# Patient Record
Sex: Male | Born: 1950 | Race: White | Hispanic: No | Marital: Married | Smoking: Former smoker
Health system: Southern US, Community
[De-identification: ages and names within clinical notes are randomized; demographics above are authoritative.]

## PROBLEM LIST (undated history)

## (undated) DIAGNOSIS — E785 Hyperlipidemia, unspecified: Secondary | ICD-10-CM

## (undated) DIAGNOSIS — R011 Cardiac murmur, unspecified: Secondary | ICD-10-CM

## (undated) DIAGNOSIS — R001 Bradycardia, unspecified: Secondary | ICD-10-CM

---

## 2001-11-01 ENCOUNTER — Emergency Department (HOSPITAL_COMMUNITY): Admission: AC | Admit: 2001-11-01 | Discharge: 2001-11-01 | Payer: Self-pay

## 2001-11-01 ENCOUNTER — Encounter: Payer: Self-pay | Admitting: Emergency Medicine

## 2005-10-21 ENCOUNTER — Emergency Department: Payer: Self-pay | Admitting: Emergency Medicine

## 2010-02-15 ENCOUNTER — Ambulatory Visit: Payer: Self-pay | Admitting: Emergency Medicine

## 2010-09-23 ENCOUNTER — Ambulatory Visit: Payer: Self-pay | Admitting: Cardiovascular Disease

## 2012-10-18 ENCOUNTER — Inpatient Hospital Stay: Payer: Self-pay | Admitting: Surgery

## 2012-10-18 LAB — CBC
HCT: 40.2 % (ref 40.0–52.0)
HCT: 28.1 % — ABNORMAL LOW (ref 40.0–52.0)
HGB: 9.3 g/dL — ABNORMAL LOW (ref 13.0–18.0)
MCH: 29 pg (ref 26.0–34.0)
MCH: 28.7 pg (ref 26.0–34.0)
MCHC: 33.7 g/dL (ref 32.0–36.0)
MCHC: 33 g/dL (ref 32.0–36.0)
MCV: 86 fL (ref 80–100)
MCV: 87 fL (ref 80–100)
Platelet: 293 10*3/uL (ref 150–440)
Platelet: 228 10*3/uL (ref 150–440)
RBC: 4.66 10*6/uL (ref 4.40–5.90)
RDW: 14.2 % (ref 11.5–14.5)
RDW: 13.9 % (ref 11.5–14.5)
WBC: 4.3 10*3/uL (ref 3.8–10.6)
WBC: 16.4 10*3/uL — ABNORMAL HIGH (ref 3.8–10.6)

## 2012-10-18 LAB — BASIC METABOLIC PANEL
Anion Gap: 5 — ABNORMAL LOW (ref 7–16)
BUN: 11 mg/dL (ref 7–18)
Calcium, Total: 8.8 mg/dL (ref 8.5–10.1)
Chloride: 108 mmol/L — ABNORMAL HIGH (ref 98–107)
Co2: 26 mmol/L (ref 21–32)
Creatinine: 0.83 mg/dL (ref 0.60–1.30)
EGFR (African American): >60
Glucose: 148 mg/dL — ABNORMAL HIGH (ref 65–99)
Potassium: 4.2 mmol/L (ref 3.5–5.1)

## 2012-10-19 LAB — CBC WITH DIFFERENTIAL/PLATELET
Basophil #: 0.1 10*3/uL (ref 0.0–0.1)
Basophil %: 0.9 %
Eosinophil #: 0 10*3/uL (ref 0.0–0.7)
Eosinophil %: 0.3 %
HGB: 11 g/dL — ABNORMAL LOW (ref 13.0–18.0)
Lymphocyte %: 15.8 %
MCH: 29.4 pg (ref 26.0–34.0)
MCHC: 34.6 g/dL (ref 32.0–36.0)
MCV: 85 fL (ref 80–100)
Monocyte %: 7.8 %
Neutrophil #: 11.1 10*3/uL — ABNORMAL HIGH (ref 1.4–6.5)
Platelet: 162 10*3/uL (ref 150–440)
RDW: 14.1 % (ref 11.5–14.5)

## 2012-10-19 LAB — COMPREHENSIVE METABOLIC PANEL
Albumin: 2.6 g/dL — ABNORMAL LOW (ref 3.4–5.0)
Alkaline Phosphatase: 59 U/L (ref 50–136)
Anion Gap: 5 — ABNORMAL LOW (ref 7–16)
Bilirubin,Total: 1 mg/dL (ref 0.2–1.0)
Calcium, Total: 7.5 mg/dL — ABNORMAL LOW (ref 8.5–10.1)
Chloride: 108 mmol/L — ABNORMAL HIGH (ref 98–107)
Co2: 25 mmol/L (ref 21–32)
Creatinine: 0.79 mg/dL (ref 0.60–1.30)
EGFR (African American): >60
Osmolality: 276 (ref 275–301)
Potassium: 4.1 mmol/L (ref 3.5–5.1)
Total Protein: 5.1 g/dL — ABNORMAL LOW (ref 6.4–8.2)

## 2012-10-19 LAB — URINALYSIS, COMPLETE
Bilirubin,UR: NEGATIVE
Glucose,UR: NEGATIVE mg/dL (ref 0–75)
Leukocyte Esterase: NEGATIVE
Protein: NEGATIVE
RBC,UR: <1 /HPF (ref 0–5)
Squamous Epithelial: NONE SEEN

## 2012-10-19 LAB — APTT: Activated PTT: 25.2 secs (ref 23.6–35.9)

## 2012-10-19 LAB — LIPASE, BLOOD: Lipase: 73 U/L (ref 73–393)

## 2012-10-19 LAB — PROTIME-INR: Prothrombin Time: 14.3 secs (ref 11.5–14.7)

## 2012-11-27 ENCOUNTER — Ambulatory Visit: Payer: Self-pay | Admitting: Neurology

## 2013-07-30 ENCOUNTER — Emergency Department: Payer: Self-pay | Admitting: Internal Medicine

## 2013-09-06 ENCOUNTER — Emergency Department: Payer: Self-pay | Admitting: Emergency Medicine

## 2014-01-01 ENCOUNTER — Ambulatory Visit: Payer: Self-pay | Admitting: Orthopedic Surgery
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
## 2015-04-20 ENCOUNTER — Other Ambulatory Visit: Payer: Self-pay | Admitting: Nurse Practitioner

## 2015-04-20 DIAGNOSIS — R519 Headache, unspecified: Secondary | ICD-10-CM

## 2015-04-20 DIAGNOSIS — R51 Headache: Principal | ICD-10-CM

## 2015-04-23 ENCOUNTER — Ambulatory Visit
Admission: RE | Admit: 2015-04-23 | Discharge: 2015-04-23 | Disposition: A | Discharge status: Home / Self Care | Payer: Medicare HMO | Source: Ambulatory Visit | Attending: Nurse Practitioner | Admitting: Nurse Practitioner

## 2015-04-23 DIAGNOSIS — R51 Headache: Secondary | ICD-10-CM | POA: Insufficient documentation

## 2015-04-23 DIAGNOSIS — R519 Headache, unspecified: Secondary | ICD-10-CM

## 2017-02-21 IMAGING — CT CT HEAD W/O CM
1 series · 16 of 28 positions shown, 20 images · non-contrast
Comparison: MR brain 11/27/2012.

CLINICAL DATA: Severe posterior headache for several months getting
worse. History of remote head trauma.

EXAM:
CT HEAD WITHOUT CONTRAST
TECHNIQUE: Contiguous axial images were obtained from the base of the skull
through the vertex without intravenous contrast.

[Series 2: head wo · axial · 0.39mm/px · z∈[-73,+52]mm · 16 of 28 slices shown, 20 images]
[im 2/28  brain]
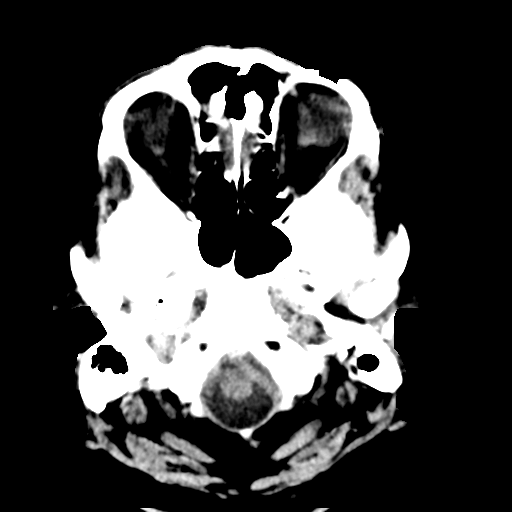
[im 2/28  bone]
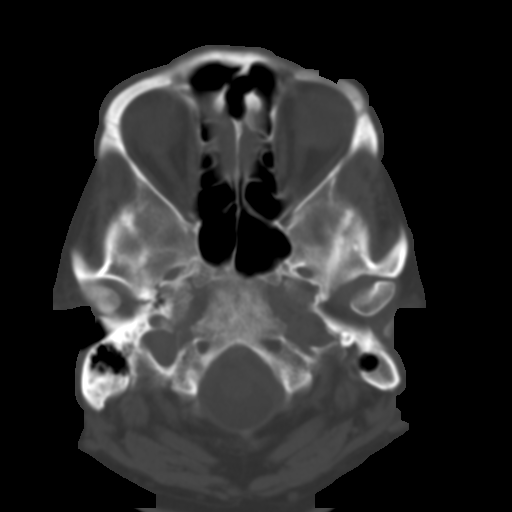
[im 4/28  brain]
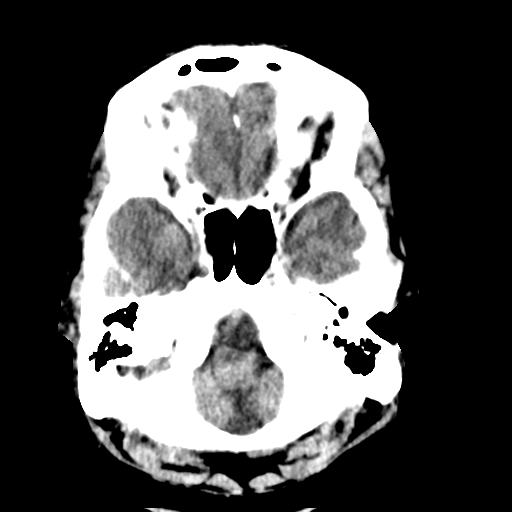
[im 6/28  brain]
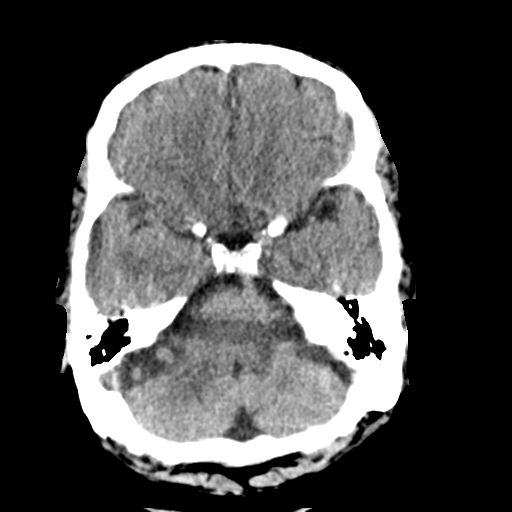
[im 7/28  brain]
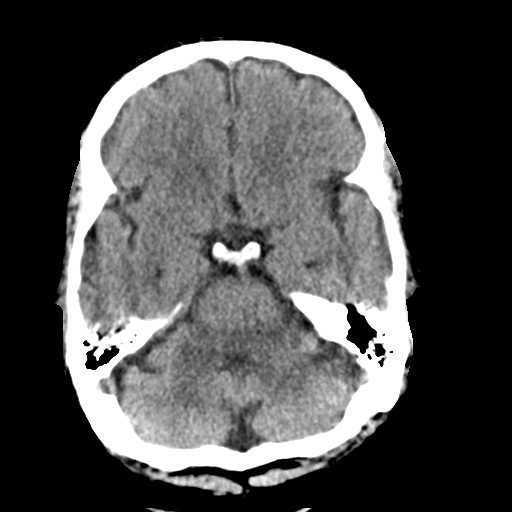
[im 9/28  brain]
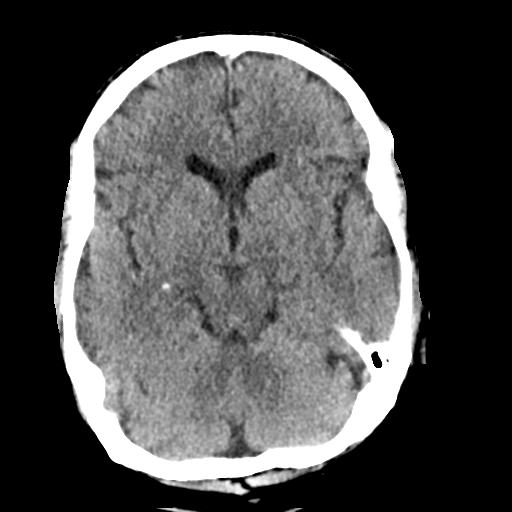
[im 9/28  bone]
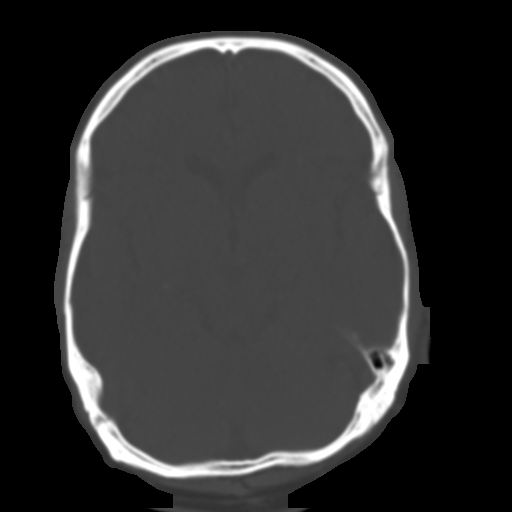
[im 10/28  brain]
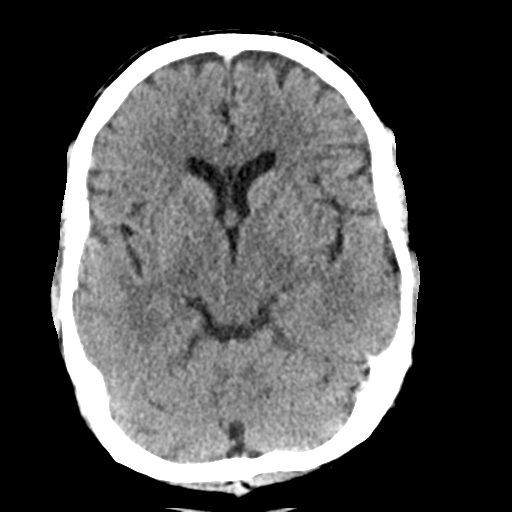
[im 12/28  brain]
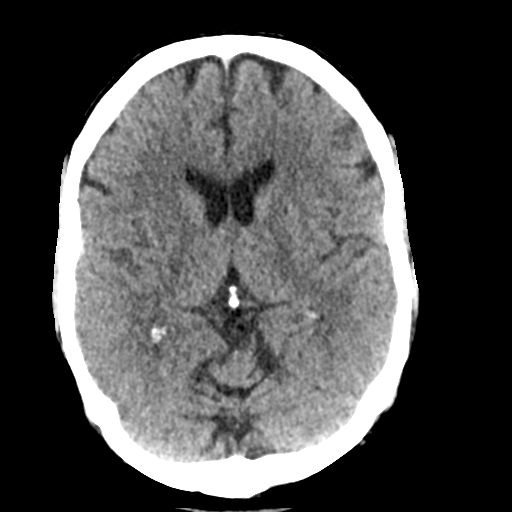
[im 14/28  brain]
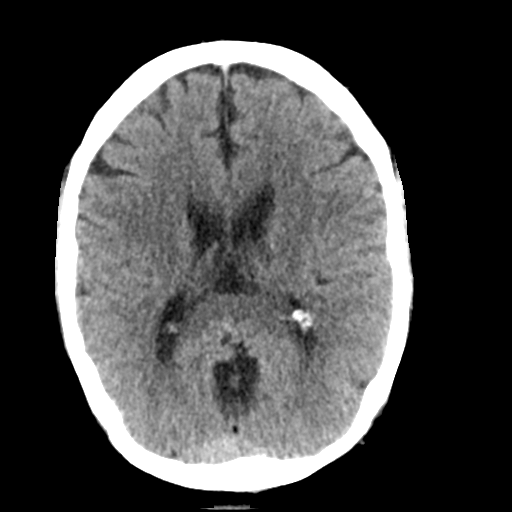
[im 15/28  brain]
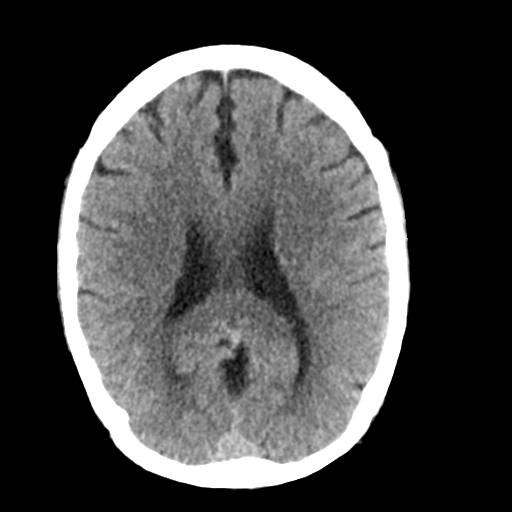
[im 15/28  bone]
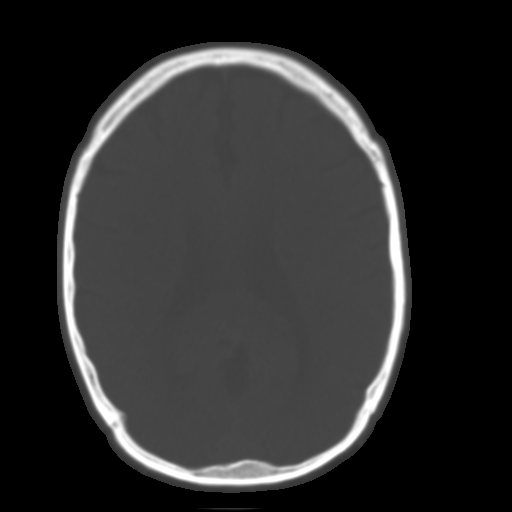
[im 17/28  brain]
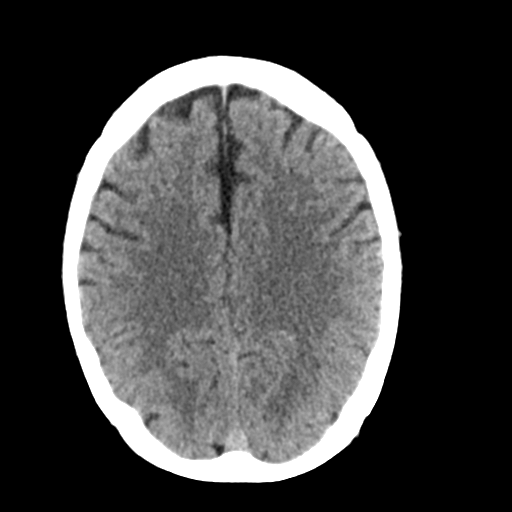
[im 19/28  brain]
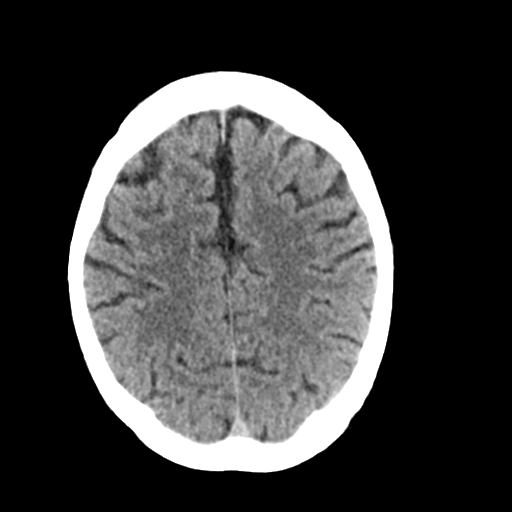
[im 20/28  brain]
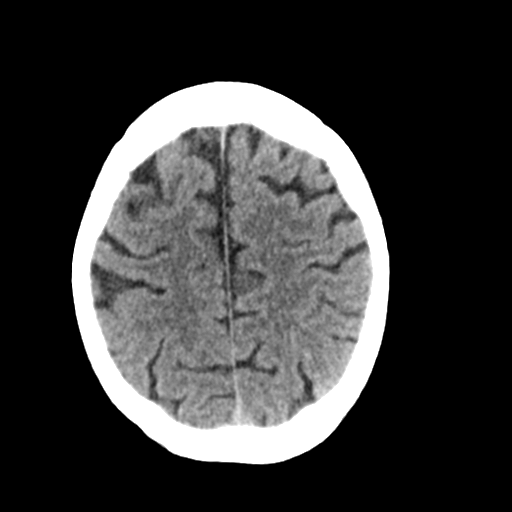
[im 22/28  brain]
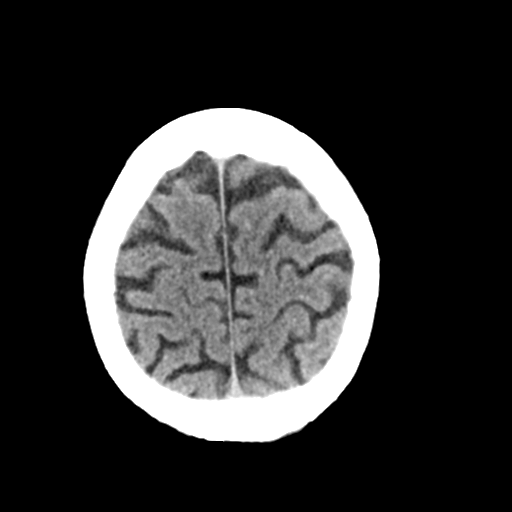
[im 22/28  bone]
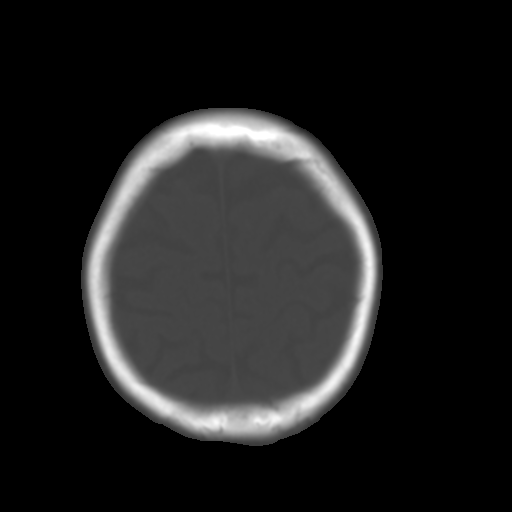
[im 23/28  brain]
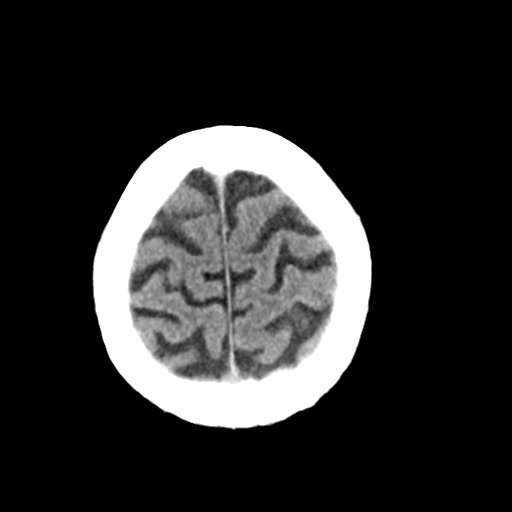
[im 25/28  brain]
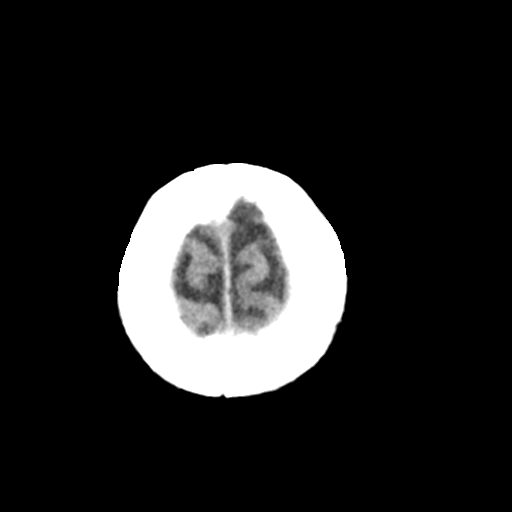
[im 27/28  brain]
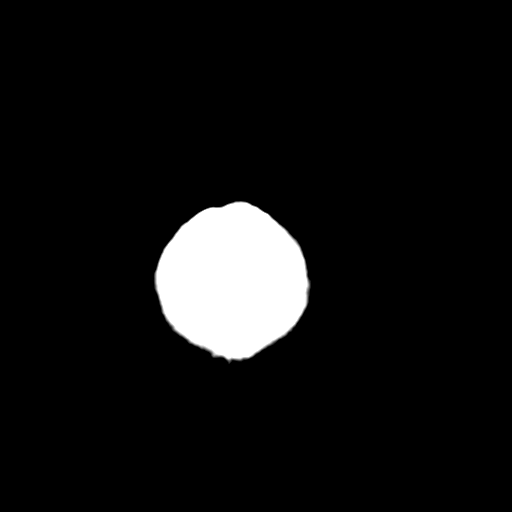

[16 of 28 positions shown; findings below may reference images not displayed]

FINDINGS: No evidence for acute infarction, hemorrhage, mass lesion,
hydrocephalus, or extra-axial fluid. Mild cerebral and cerebellar
atrophy. No significant white matter disease. No signs of cortical
or lacunar infarction. No features suggestive of large vessel
occlusion. Moderate vascular calcification. Calvarium intact. No
sinus or mastoid disease. Similar appearance to priors.
IMPRESSION: Mild chronic changes as described.  No acute intracranial findings.

## 2022-07-26 ENCOUNTER — Encounter: Payer: Self-pay | Admitting: Ophthalmology

## 2022-07-31 NOTE — Discharge Instructions (Signed)

## 2022-08-02 ENCOUNTER — Ambulatory Visit: Payer: Medicare HMO | Admitting: Anesthesiology

## 2022-08-02 ENCOUNTER — Other Ambulatory Visit: Payer: Self-pay

## 2022-08-02 ENCOUNTER — Ambulatory Visit
Admission: RE | Admit: 2022-08-02 | Discharge: 2022-08-02 | Disposition: A | Discharge status: Home / Self Care | Payer: Medicare HMO | Attending: Ophthalmology | Admitting: Ophthalmology

## 2022-08-02 ENCOUNTER — Encounter
Admission: RE | Disposition: A | Discharge status: Home / Self Care | Payer: Self-pay | Source: Home / Self Care | Attending: Ophthalmology

## 2022-08-02 ENCOUNTER — Encounter: Payer: Self-pay | Admitting: Ophthalmology

## 2022-08-02 DIAGNOSIS — E785 Hyperlipidemia, unspecified: Secondary | ICD-10-CM | POA: Insufficient documentation

## 2022-08-02 DIAGNOSIS — H2512 Age-related nuclear cataract, left eye: Secondary | ICD-10-CM | POA: Diagnosis present

## 2022-08-02 DIAGNOSIS — Z87891 Personal history of nicotine dependence: Secondary | ICD-10-CM | POA: Insufficient documentation

## 2022-08-02 HISTORY — DX: Cardiac murmur, unspecified: R01.1

## 2022-08-02 HISTORY — DX: Hyperlipidemia, unspecified: E78.5

## 2022-08-02 HISTORY — DX: Bradycardia, unspecified: R00.1

## 2022-08-02 HISTORY — PX: CATARACT EXTRACTION W/PHACO: SHX586

## 2022-08-02 SURGERY — PHACOEMULSIFICATION, CATARACT, WITH IOL INSERTION
Anesthesia: Monitor Anesthesia Care | Site: Eye | Laterality: Left

## 2022-08-02 MED ORDER — TETRACAINE HCL 0.5 % OP SOLN
1.0000 [drp] | OPHTHALMIC | Status: DC | PRN
  Administered 2022-08-02 (×3): 1 [drp] via OPHTHALMIC

## 2022-08-02 MED ORDER — SIGHTPATH DOSE#1 BSS IO SOLN
INTRAOCULAR | Status: DC | PRN
  Administered 2022-08-02: 1 mL

## 2022-08-02 MED ORDER — CEFUROXIME OPHTHALMIC INJECTION 1 MG/0.1 ML
INJECTION | OPHTHALMIC | Status: DC | PRN
  Administered 2022-08-02: .1 mL via INTRACAMERAL

## 2022-08-02 MED ORDER — FENTANYL CITRATE (PF) 100 MCG/2ML IJ SOLN
INTRAMUSCULAR | Status: DC | PRN
  Administered 2022-08-02 (×2): 50 ug via INTRAVENOUS

## 2022-08-02 MED ORDER — ARMC OPHTHALMIC DILATING DROPS
1.0000 | OPHTHALMIC | Status: DC | PRN
  Administered 2022-08-02 (×3): 1 via OPHTHALMIC

## 2022-08-02 MED ORDER — LACTATED RINGERS IV SOLN: INTRAVENOUS | Status: DC

## 2022-08-02 MED ORDER — BRIMONIDINE TARTRATE-TIMOLOL 0.2-0.5 % OP SOLN
OPHTHALMIC | Status: DC | PRN
  Administered 2022-08-02: 1 [drp] via OPHTHALMIC

## 2022-08-02 MED ORDER — SIGHTPATH DOSE#1 NA HYALUR & NA CHOND-NA HYALUR IO KIT
PACK | INTRAOCULAR | Status: DC | PRN
  Administered 2022-08-02: 1 via OPHTHALMIC

## 2022-08-02 MED ORDER — MIDAZOLAM HCL 2 MG/2ML IJ SOLN
INTRAMUSCULAR | Status: DC | PRN
  Administered 2022-08-02 (×2): 1 mg via INTRAVENOUS

## 2022-08-02 MED ORDER — SIGHTPATH DOSE#1 BSS IO SOLN
INTRAOCULAR | Status: DC | PRN
  Administered 2022-08-02: 73 mL via OPHTHALMIC

## 2022-08-02 MED ORDER — SIGHTPATH DOSE#1 BSS IO SOLN
INTRAOCULAR | Status: DC | PRN
  Administered 2022-08-02: 15 mL

## 2022-08-02 SURGICAL SUPPLY — 10 items
CATARACT SUITE SIGHTPATH (MISCELLANEOUS) ×1 IMPLANT
FEE CATARACT SUITE SIGHTPATH (MISCELLANEOUS) ×1 IMPLANT
GLOVE SRG 8 PF TXTR STRL LF DI (GLOVE) ×1 IMPLANT
GLOVE SURG ENC TEXT LTX SZ7.5 (GLOVE) ×1 IMPLANT
GLOVE SURG UNDER POLY LF SZ8 (GLOVE) ×1
LENS IOL TECNIS EYHANCE 20.0 (Intraocular Lens) IMPLANT
NDL FILTER BLUNT 18X1 1/2 (NEEDLE) ×1 IMPLANT
NEEDLE FILTER BLUNT 18X1 1/2 (NEEDLE) ×1 IMPLANT
SYR 3ML LL SCALE MARK (SYRINGE) ×1 IMPLANT
WATER STERILE IRR 250ML POUR (IV SOLUTION) ×1 IMPLANT

## 2022-08-02 NOTE — Op Note (Signed)
OPERATIVE NOTE  Darren Sampson [id number] 08/02/2022   PREOPERATIVE DIAGNOSIS:  Nuclear sclerotic cataract left eye. H25.12   POSTOPERATIVE DIAGNOSIS:    Nuclear sclerotic cataract left eye.     PROCEDURE:  Phacoemusification with posterior chamber intraocular lens placement of the left eye  Ultrasound time: Procedure(s): CATARACT EXTRACTION PHACO AND INTRAOCULAR LENS PLACEMENT (IOC) LEFT  11.42  00:57.9 (Left)  LENS:   Implant Name Type Inv. Item Serial No. Manufacturer Lot No. LRB No. Used Action  LENS IOL TECNIS EYHANCE 20.0 - [id number] Intraocular Lens LENS IOL TECNIS EYHANCE 20.0 [id number] [employer]  Left 1 Implanted      SURGEON:  Wyonia Hough, MD   ANESTHESIA:  Topical with tetracaine drops and 2% Xylocaine jelly, augmented with 1% preservative-free intracameral lidocaine.    COMPLICATIONS:  None.   DESCRIPTION OF PROCEDURE:  The patient was identified in the holding room and transported to the operating room and placed in the supine position under the operating microscope.  The left eye was identified as the operative eye and it was prepped and draped in the usual sterile ophthalmic fashion.   A 1 millimeter clear-corneal paracentesis was made at the 1:30 position.  0.5 ml of preservative-free 1% lidocaine was injected into the anterior chamber.  The anterior chamber was filled with Viscoat viscoelastic.  A 2.4 millimeter keratome was used to make a near-clear corneal incision at the 10:30 position.  .  A curvilinear capsulorrhexis was made with a cystotome and capsulorrhexis forceps.  Balanced salt solution was used to hydrodissect and hydrodelineate the nucleus.   Phacoemulsification was then used in stop and chop fashion to remove the lens nucleus and epinucleus.  The remaining cortex was then removed using the irrigation and aspiration handpiece. Provisc was then placed into the capsular bag to distend it for lens placement.  A lens was then injected into the  capsular bag.  The remaining viscoelastic was aspirated.   Wounds were hydrated with balanced salt solution.  The anterior chamber was inflated to a physiologic pressure with balanced salt solution.  No wound leaks were noted. Cefuroxime 0.1 ml of a 10mg /ml solution was injected into the anterior chamber for a dose of 1 mg of intracameral antibiotic at the completion of the case.   Timolol and Brimonidine drops were applied to the eye.  The patient was taken to the recovery room in stable condition without complications of anesthesia or surgery.  [name] 08/02/2022, 12:06 PM

## 2022-08-02 NOTE — Transfer of Care (Signed)
Immediate Anesthesia Transfer of Care Note  Patient: Darren Sampson  Procedure(s) Performed: CATARACT EXTRACTION PHACO AND INTRAOCULAR LENS PLACEMENT (IOC) LEFT (Left: Eye)  Patient Location: PACU  Anesthesia Type: MAC  Level of Consciousness: awake, alert  and patient cooperative  Airway and Oxygen Therapy: Patient Spontanous Breathing and Patient connected to supplemental oxygen  Post-op Assessment: Post-op Vital signs reviewed, Patient's Cardiovascular Status Stable, Respiratory Function Stable, Patent Airway and No signs of Nausea or vomiting  Post-op Vital Signs: Reviewed and stable  Complications: No notable events documented.

## 2022-08-02 NOTE — Anesthesia Preprocedure Evaluation (Signed)
Anesthesia Evaluation  Patient identified by MRN, date of birth, ID band Patient awake    Reviewed: Allergy & Precautions, NPO status , Patient's Chart, lab work & pertinent test results  History of Anesthesia Complications Negative for: history of anesthetic complications  Airway Mallampati: III  TM Distance: <3 FB Neck ROM: full    Dental  (+) Chipped, Poor Dentition   Pulmonary neg pulmonary ROS, neg shortness of breath, former smoker   Pulmonary exam normal        Cardiovascular Exercise Tolerance: Good (-) angina Normal cardiovascular exam     Neuro/Psych negative neurological ROS  negative psych ROS   GI/Hepatic negative GI ROS, Neg liver ROS,neg GERD  ,,  Endo/Other  negative endocrine ROS    Renal/GU      Musculoskeletal   Abdominal   Peds  Hematology negative hematology ROS (+)   Anesthesia Other Findings Past Medical History: No date: Bradycardia No date: Heart murmur No date: Hyperlipidemia  History reviewed. No pertinent surgical history.  BMI    Body Mass Index: 27.78 kg/m      Reproductive/Obstetrics negative OB ROS                             Anesthesia Physical Anesthesia Plan  ASA: 3  Anesthesia Plan: MAC   Post-op Pain Management:    Induction: Intravenous  PONV Risk Score and Plan:   Airway Management Planned: Natural Airway and Nasal Cannula  Additional Equipment:   Intra-op Plan:   Post-operative Plan:   Informed Consent: I have reviewed the patients History and Physical, chart, labs and discussed the procedure including the risks, benefits and alternatives for the proposed anesthesia with the patient or authorized representative who has indicated his/her understanding and acceptance.     Dental Advisory Given  Plan Discussed with: Anesthesiologist, CRNA and Surgeon  Anesthesia Plan Comments: (Patient consented for risks of anesthesia  including but not limited to:  - adverse reactions to medications - damage to eyes, teeth, lips or other oral mucosa - nerve damage due to positioning  - sore throat or hoarseness - Damage to heart, brain, nerves, lungs, other parts of body or loss of life  Patient voiced understanding.)       Anesthesia Quick Evaluation

## 2022-08-02 NOTE — H&P (Signed)
[facility]   Primary Care Physician:  [facility] Ophthalmologist: Dr. Leandrew Koyanagi  Pre-Procedure History & Physical: HPI:  Darren Sampson is a 72 y.o. male here for ophthalmic surgery.   Past Medical History:  Diagnosis Date   Bradycardia    Heart murmur    Hyperlipidemia     History reviewed. No pertinent surgical history.  Prior to Admission medications   Medication Sig Start Date End Date Taking? Authorizing Provider  ASPIRIN 81 PO Take by mouth daily.   Yes [provider]  buPROPion (ZYBAN) 150 MG 12 hr tablet Take 150 mg by mouth.   Yes [provider]  donepezil (ARICEPT ODT) 5 MG disintegrating tablet Take 5 mg by mouth at bedtime.   Yes [provider]  sertraline (ZOLOFT) 50 MG tablet Take 50 mg by mouth daily.   Yes [provider]  simvastatin (ZOCOR) 40 MG tablet Take 40 mg by mouth daily.   Yes [provider]    Allergies as of 07/19/2022   (Not on File)    History reviewed. No pertinent family history.  Social History   Socioeconomic History   Marital status: Married    Spouse name: Not on file   Number of children: Not on file   Years of education: Not on file   Highest education level: Not on file  Occupational History   Not on file  Tobacco Use   Smoking status: Former    Types: Cigarettes    Quit date: 4    Years since quitting: 29.2   Smokeless tobacco: Never  Vaping Use   Vaping Use: Never used  Substance and Sexual Activity   Alcohol use: Not Currently   Drug use: Not on file   Sexual activity: Not on file  Other Topics Concern   Not on file  Social History Narrative   Not on file   Social Determinants of Health   Financial Resource Strain: Not on file  Food Insecurity: Not on file  Transportation Needs: Not on file  Physical Activity: Not on file  Stress: Not on file  Social Connections: Not on file  Intimate Partner Violence: Not on file     Review of Systems: See HPI, otherwise negative ROS  Physical Exam: BP 134/79   Pulse (!) 57   Temp 98.2 F (36.8 C) (Temporal)   Resp 15   Ht 6\' 2"  (1.88 m)   Wt 98.2 kg   SpO2 96%   BMI 27.78 kg/m  General:   Alert,  pleasant and cooperative in NAD Head:  Normocephalic and atraumatic. Lungs:  Clear to auscultation.    Heart:  Regular rate and rhythm.   Impression/Plan: Darren Sampson is here for ophthalmic surgery.  Risks, benefits, limitations, and alternatives regarding ophthalmic surgery have been reviewed with the patient.  Questions have been answered.  All parties agreeable.   Leandrew Koyanagi, MD  08/02/2022, 11:24 AM

## 2022-08-02 NOTE — Anesthesia Postprocedure Evaluation (Signed)
Anesthesia Post Note  Patient: Darren Sampson  Procedure(s) Performed: CATARACT EXTRACTION PHACO AND INTRAOCULAR LENS PLACEMENT (IOC) LEFT  11.42  00:57.9 (Left: Eye)  Patient location during evaluation: PACU Anesthesia Type: MAC Level of consciousness: awake and alert Pain management: pain level controlled Vital Signs Assessment: post-procedure vital signs reviewed and stable Respiratory status: spontaneous breathing, nonlabored ventilation, respiratory function stable and patient connected to nasal cannula oxygen Cardiovascular status: blood pressure returned to baseline and stable Postop Assessment: no apparent nausea or vomiting Anesthetic complications: no   No notable events documented.   Last Vitals:  Vitals:   08/02/22 1207 08/02/22 1211  BP: (!) 149/76 135/83  Pulse: (!) 58 (!) 56  Resp: 14 10  Temp: 36.4 C 36.4 C  SpO2: 93% 91%    Last Pain:  Vitals:   08/02/22 1211  TempSrc:   PainSc: 0-No pain                 Precious Haws Brooklynne Pereida

## 2022-08-03 ENCOUNTER — Other Ambulatory Visit: Payer: Self-pay

## 2022-08-03 ENCOUNTER — Encounter: Payer: Self-pay | Admitting: Ophthalmology

## 2022-08-14 NOTE — Discharge Instructions (Signed)
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
## 2022-08-16 ENCOUNTER — Encounter
Admission: RE | Disposition: A | Discharge status: Home / Self Care | Payer: Self-pay | Source: Home / Self Care | Attending: Ophthalmology

## 2022-08-16 ENCOUNTER — Other Ambulatory Visit: Payer: Self-pay

## 2022-08-16 ENCOUNTER — Encounter: Payer: Self-pay | Admitting: Ophthalmology

## 2022-08-16 ENCOUNTER — Ambulatory Visit
Admission: RE | Admit: 2022-08-16 | Discharge: 2022-08-16 | Disposition: A | Discharge status: Home / Self Care | Payer: Medicare HMO | Attending: Ophthalmology | Admitting: Ophthalmology

## 2022-08-16 ENCOUNTER — Ambulatory Visit: Payer: Medicare HMO | Admitting: General Practice

## 2022-08-16 DIAGNOSIS — H2511 Age-related nuclear cataract, right eye: Secondary | ICD-10-CM | POA: Insufficient documentation

## 2022-08-16 DIAGNOSIS — E785 Hyperlipidemia, unspecified: Secondary | ICD-10-CM | POA: Diagnosis not present

## 2022-08-16 DIAGNOSIS — F419 Anxiety disorder, unspecified: Secondary | ICD-10-CM | POA: Insufficient documentation

## 2022-08-16 DIAGNOSIS — R001 Bradycardia, unspecified: Secondary | ICD-10-CM | POA: Insufficient documentation

## 2022-08-16 DIAGNOSIS — Z87891 Personal history of nicotine dependence: Secondary | ICD-10-CM | POA: Insufficient documentation

## 2022-08-16 DIAGNOSIS — I081 Rheumatic disorders of both mitral and tricuspid valves: Secondary | ICD-10-CM | POA: Diagnosis not present

## 2022-08-16 DIAGNOSIS — R011 Cardiac murmur, unspecified: Secondary | ICD-10-CM | POA: Diagnosis not present

## 2022-08-16 HISTORY — PX: CATARACT EXTRACTION W/PHACO: SHX586

## 2022-08-16 SURGERY — PHACOEMULSIFICATION, CATARACT, WITH IOL INSERTION
Anesthesia: Monitor Anesthesia Care | Site: Eye | Laterality: Right

## 2022-08-16 MED ORDER — LACTATED RINGERS IV SOLN: INTRAVENOUS | Status: DC

## 2022-08-16 MED ORDER — ARMC OPHTHALMIC DILATING DROPS
1.0000 | OPHTHALMIC | Status: DC | PRN
  Administered 2022-08-16 (×3): 1 via OPHTHALMIC

## 2022-08-16 MED ORDER — CEFUROXIME OPHTHALMIC INJECTION 1 MG/0.1 ML
INJECTION | OPHTHALMIC | Status: DC | PRN
  Administered 2022-08-16: .1 mL via INTRACAMERAL

## 2022-08-16 MED ORDER — BRIMONIDINE TARTRATE-TIMOLOL 0.2-0.5 % OP SOLN
OPHTHALMIC | Status: DC | PRN
  Administered 2022-08-16: 1 [drp] via OPHTHALMIC

## 2022-08-16 MED ORDER — SIGHTPATH DOSE#1 BSS IO SOLN
INTRAOCULAR | Status: DC | PRN
  Administered 2022-08-16: 61 mL via OPHTHALMIC

## 2022-08-16 MED ORDER — SIGHTPATH DOSE#1 NA HYALUR & NA CHOND-NA HYALUR IO KIT
PACK | INTRAOCULAR | Status: DC | PRN
  Administered 2022-08-16: 1 via OPHTHALMIC

## 2022-08-16 MED ORDER — MIDAZOLAM HCL 2 MG/2ML IJ SOLN
INTRAMUSCULAR | Status: DC | PRN
  Administered 2022-08-16: 2 mg via INTRAVENOUS

## 2022-08-16 MED ORDER — SIGHTPATH DOSE#1 BSS IO SOLN
INTRAOCULAR | Status: DC | PRN
  Administered 2022-08-16: 15 mL via INTRAOCULAR

## 2022-08-16 MED ORDER — TETRACAINE HCL 0.5 % OP SOLN
1.0000 [drp] | OPHTHALMIC | Status: DC | PRN
  Administered 2022-08-16 (×3): 1 [drp] via OPHTHALMIC

## 2022-08-16 MED ORDER — FENTANYL CITRATE (PF) 100 MCG/2ML IJ SOLN
INTRAMUSCULAR | Status: DC | PRN
  Administered 2022-08-16: 50 ug via INTRAVENOUS

## 2022-08-16 MED ORDER — SIGHTPATH DOSE#1 BSS IO SOLN
INTRAOCULAR | Status: DC | PRN
  Administered 2022-08-16: 2 mL

## 2022-08-16 SURGICAL SUPPLY — 20 items
CANNULA ANT/CHMB 27G (MISCELLANEOUS) IMPLANT
CANNULA ANT/CHMB 27GA (MISCELLANEOUS) IMPLANT
CATARACT SUITE SIGHTPATH (MISCELLANEOUS) ×1 IMPLANT
FEE CATARACT SUITE SIGHTPATH (MISCELLANEOUS) ×1 IMPLANT
GLOVE SRG 8 PF TXTR STRL LF DI (GLOVE) ×1 IMPLANT
GLOVE SURG ENC TEXT LTX SZ7.5 (GLOVE) ×1 IMPLANT
GLOVE SURG GAMMEX PI TX LF 7.5 (GLOVE) IMPLANT
GLOVE SURG UNDER POLY LF SZ8 (GLOVE) ×1
LENS IOL TECNIS EYHANCE 20.0 (Intraocular Lens) IMPLANT
NDL FILTER BLUNT 18X1 1/2 (NEEDLE) ×1 IMPLANT
NDL RETROBULBAR .5 NSTRL (NEEDLE) IMPLANT
NEEDLE FILTER BLUNT 18X1 1/2 (NEEDLE) ×1 IMPLANT
PACK VIT ANT 23G (MISCELLANEOUS) IMPLANT
RING MALYGIN 7.0 (MISCELLANEOUS) IMPLANT
SUT ETHILON 10-0 CS-B-6CS-B-6 (SUTURE)
SUT VICRYL  9 0 (SUTURE)
SUT VICRYL 9 0 (SUTURE) IMPLANT
SUTURE EHLN 10-0 CS-B-6CS-B-6 (SUTURE) IMPLANT
SYR 3ML LL SCALE MARK (SYRINGE) ×1 IMPLANT
WATER STERILE IRR 250ML POUR (IV SOLUTION) ×1 IMPLANT

## 2022-08-16 NOTE — Transfer of Care (Signed)
Immediate Anesthesia Transfer of Care Note  Patient: Darren Sampson  Procedure(s) Performed: CATARACT EXTRACTION PHACO AND INTRAOCULAR LENS PLACEMENT (IOC) RIGHT 4.72 00:30.7 (Right: Eye)  Patient Location: PACU  Anesthesia Type: MAC  Level of Consciousness: awake, alert  and patient cooperative  Airway and Oxygen Therapy: Patient Spontanous Breathing and Patient connected to supplemental oxygen  Post-op Assessment: Post-op Vital signs reviewed, Patient's Cardiovascular Status Stable, Respiratory Function Stable, Patent Airway and No signs of Nausea or vomiting  Post-op Vital Signs: Reviewed and stable  Complications: No notable events documented.

## 2022-08-16 NOTE — Anesthesia Postprocedure Evaluation (Signed)
Anesthesia Post Note  Patient: Darren Sampson  Procedure(s) Performed: CATARACT EXTRACTION PHACO AND INTRAOCULAR LENS PLACEMENT (IOC) RIGHT 4.72 00:30.7 (Right: Eye)  Patient location during evaluation: PACU Anesthesia Type: MAC Level of consciousness: awake and alert Pain management: pain level controlled Vital Signs Assessment: post-procedure vital signs reviewed and stable Respiratory status: spontaneous breathing, nonlabored ventilation, respiratory function stable and patient connected to nasal cannula oxygen Cardiovascular status: stable and blood pressure returned to baseline Postop Assessment: no apparent nausea or vomiting Anesthetic complications: no   No notable events documented.   Last Vitals:  Vitals:   08/16/22 1154 08/16/22 1159  BP: 133/88 129/88  Pulse: (!) 56 (!) 54  Resp: 10 11  Temp: 36.6 C 36.6 C  SpO2: 94% 93%    Last Pain:  Vitals:   08/16/22 1159  TempSrc: Temporal  PainSc: 0-No pain                 Farah Benish C Zarion Oliff

## 2022-08-16 NOTE — Op Note (Signed)
LOCATION:  [facility] [facility]   PREOPERATIVE DIAGNOSIS:    Nuclear sclerotic cataract right eye. H25.11   POSTOPERATIVE DIAGNOSIS:  Nuclear sclerotic cataract right eye.     PROCEDURE:  Phacoemusification with posterior chamber intraocular lens placement of the right eye   ULTRASOUND TIME: Procedure(s): CATARACT EXTRACTION PHACO AND INTRAOCULAR LENS PLACEMENT (IOC) RIGHT 4.72 00:30.7 (Right)  LENS:   Implant Name Type Inv. Item Serial No. Manufacturer Lot No. LRB No. Used Action  LENS IOL TECNIS EYHANCE 20.0 - [id number] Intraocular Lens LENS IOL TECNIS EYHANCE 20.0 [id number] [employer]  Right 1 Implanted         SURGEON:  Wyonia Hough, MD   ANESTHESIA:  Topical with tetracaine drops and 2% Xylocaine jelly, augmented with 1% preservative-free intracameral lidocaine.    COMPLICATIONS:  None.   DESCRIPTION OF PROCEDURE:  The patient was identified in the holding room and transported to the operating room and placed in the supine position under the operating microscope.  The right eye was identified as the operative eye and it was prepped and draped in the usual sterile ophthalmic fashion.   A 1 millimeter clear-corneal paracentesis was made at the 12:00 position.  0.5 ml of preservative-free 1% lidocaine was injected into the anterior chamber. The anterior chamber was filled with Viscoat viscoelastic.  A 2.4 millimeter keratome was used to make a near-clear corneal incision at the 9:00 position.  A curvilinear capsulorrhexis was made with a cystotome and capsulorrhexis forceps.  Balanced salt solution was used to hydrodissect and hydrodelineate the nucleus.   Phacoemulsification was then used in stop and chop fashion to remove the lens nucleus and epinucleus.  The remaining cortex was then removed using the irrigation and aspiration handpiece. Provisc was then placed into the capsular bag to distend it for lens placement.  A lens was then injected into the capsular bag.  The  remaining viscoelastic was aspirated.   Wounds were hydrated with balanced salt solution.  The anterior chamber was inflated to a physiologic pressure with balanced salt solution.  No wound leaks were noted. Cefuroxime 0.1 ml of a 10mg /ml solution was injected into the anterior chamber for a dose of 1 mg of intracameral antibiotic at the completion of the case.   Timolol and Brimonidine drops were applied to the eye.  The patient was taken to the recovery room in stable condition without complications of anesthesia or surgery.   Sarah Baez 08/16/2022, 11:52 AM

## 2022-08-16 NOTE — H&P (Signed)
  [place]   Primary Care Physician:  [facility] Ophthalmologist: Dr. Leandrew Koyanagi  Pre-Procedure History & Physical: HPI:  Darren Sampson is a 72 y.o. male here for ophthalmic surgery.   Past Medical History:  Diagnosis Date   Bradycardia    Heart murmur    Hyperlipidemia     Past Surgical History:  Procedure Laterality Date   CATARACT EXTRACTION W/PHACO Left 08/02/2022   Procedure: CATARACT EXTRACTION PHACO AND INTRAOCULAR LENS PLACEMENT (IOC) LEFT  11.42  00:57.9;  Surgeon: Leandrew Koyanagi, MD;  Location: [facility];  Service: Ophthalmology;  Laterality: Left;    Prior to Admission medications   Medication Sig Start Date End Date Taking? Authorizing Provider  ASPIRIN 81 PO Take by mouth daily.   Yes [provider]  buPROPion (ZYBAN) 150 MG 12 hr tablet Take 150 mg by mouth.   Yes [provider]  donepezil (ARICEPT ODT) 5 MG disintegrating tablet Take 5 mg by mouth at bedtime.   Yes [provider]  sertraline (ZOLOFT) 50 MG tablet Take 50 mg by mouth daily.   Yes [provider]  simvastatin (ZOCOR) 40 MG tablet Take 40 mg by mouth daily.   Yes [provider]    Allergies as of 07/19/2022   (Not on File)    History reviewed. No pertinent family history.  Social History   Socioeconomic History   Marital status: Married    Spouse name: Not on file   Number of children: Not on file   Years of education: Not on file   Highest education level: Not on file  Occupational History   Not on file  Tobacco Use   Smoking status: Former    Types: Cigarettes    Quit date: 73    Years since quitting: 29.2   Smokeless tobacco: Never  Vaping Use   Vaping Use: Never used  Substance and Sexual Activity   Alcohol use: Not Currently   Drug use: Not on file   Sexual activity: Not on file  Other Topics Concern   Not on file  Social History Narrative   Not on file   Social  Determinants of Health   Financial Resource Strain: Not on file  Food Insecurity: Not on file  Transportation Needs: Not on file  Physical Activity: Not on file  Stress: Not on file  Social Connections: Not on file  Intimate Partner Violence: Not on file    Review of Systems: See HPI, otherwise negative ROS  Physical Exam: BP (!) 154/87   Temp 98.4 F (36.9 C) (Tympanic)   Ht 6' 2.02" (1.88 m)   Wt 98.4 kg   SpO2 94%   BMI 27.84 kg/m  General:   Alert,  pleasant and cooperative in NAD Head:  Normocephalic and atraumatic. Lungs:  Clear to auscultation.    Heart:  Regular rate and rhythm.   Impression/Plan: Darren Sampson is here for ophthalmic surgery.  Risks, benefits, limitations, and alternatives regarding ophthalmic surgery have been reviewed with the patient.  Questions have been answered.  All parties agreeable.   Leandrew Koyanagi, MD  08/16/2022, 11:12 AM

## 2022-08-17 ENCOUNTER — Encounter: Payer: Self-pay | Admitting: Ophthalmology
